# Patient Record
Sex: Male | Born: 1970 | Race: White | Hispanic: No | Marital: Married
Health system: Midwestern US, Academic
[De-identification: ages and names within clinical notes are randomized; demographics above are authoritative.]

---

## 2017-09-08 ENCOUNTER — Encounter: Admit: 2017-09-08 | Discharge: 2017-09-08 | Payer: BC Managed Care – PPO

## 2017-09-08 DIAGNOSIS — R931 Abnormal findings on diagnostic imaging of heart and coronary circulation: Principal | ICD-10-CM

## 2017-09-08 DIAGNOSIS — I10 Essential (primary) hypertension: Principal | ICD-10-CM

## 2017-09-09 LAB — COMPREHENSIVE METABOLIC PANEL
Lab: 0.6
Lab: 110
Lab: 17
Lab: 18
Lab: 4.5
Lab: 62
Lab: 7.7

## 2017-09-09 LAB — LIPID PROFILE
Lab: 23
Lab: 5

## 2017-09-10 ENCOUNTER — Ambulatory Visit: Admit: 2017-09-10 | Discharge: 2017-09-11 | Payer: BC Managed Care – PPO

## 2017-09-10 ENCOUNTER — Encounter: Admit: 2017-09-10 | Discharge: 2017-09-10 | Payer: BC Managed Care – PPO

## 2017-09-10 DIAGNOSIS — R931 Abnormal findings on diagnostic imaging of heart and coronary circulation: ICD-10-CM

## 2017-09-10 DIAGNOSIS — E782 Mixed hyperlipidemia: ICD-10-CM

## 2017-09-10 DIAGNOSIS — I10 Essential (primary) hypertension: Principal | ICD-10-CM

## 2017-09-10 DIAGNOSIS — Z8249 Family history of ischemic heart disease and other diseases of the circulatory system: ICD-10-CM

## 2017-09-10 MED ORDER — ATORVASTATIN 20 MG PO TAB
20 mg | ORAL_TABLET | Freq: Every day | ORAL | 3 refills | Status: AC
Start: 2017-09-10 — End: 2018-11-18

## 2017-10-22 ENCOUNTER — Encounter: Admit: 2017-10-22 | Discharge: 2017-10-22 | Payer: BC Managed Care – PPO

## 2017-10-22 DIAGNOSIS — R931 Abnormal findings on diagnostic imaging of heart and coronary circulation: ICD-10-CM

## 2017-10-22 DIAGNOSIS — I10 Essential (primary) hypertension: Principal | ICD-10-CM

## 2017-10-22 DIAGNOSIS — E782 Mixed hyperlipidemia: ICD-10-CM

## 2018-11-18 ENCOUNTER — Encounter: Admit: 2018-11-18 | Discharge: 2018-11-18

## 2018-11-18 MED ORDER — ATORVASTATIN 20 MG PO TAB
ORAL_TABLET | Freq: Every day | 1 refills | Status: AC
Start: 2018-11-18 — End: ?

## 2019-09-14 IMAGING — RF FINGERS, RT
1 series · 2 of 2 positions shown · non-contrast
Comparison: none

[Series 1: run · 2 of 2 slices shown]
[im 1/2]
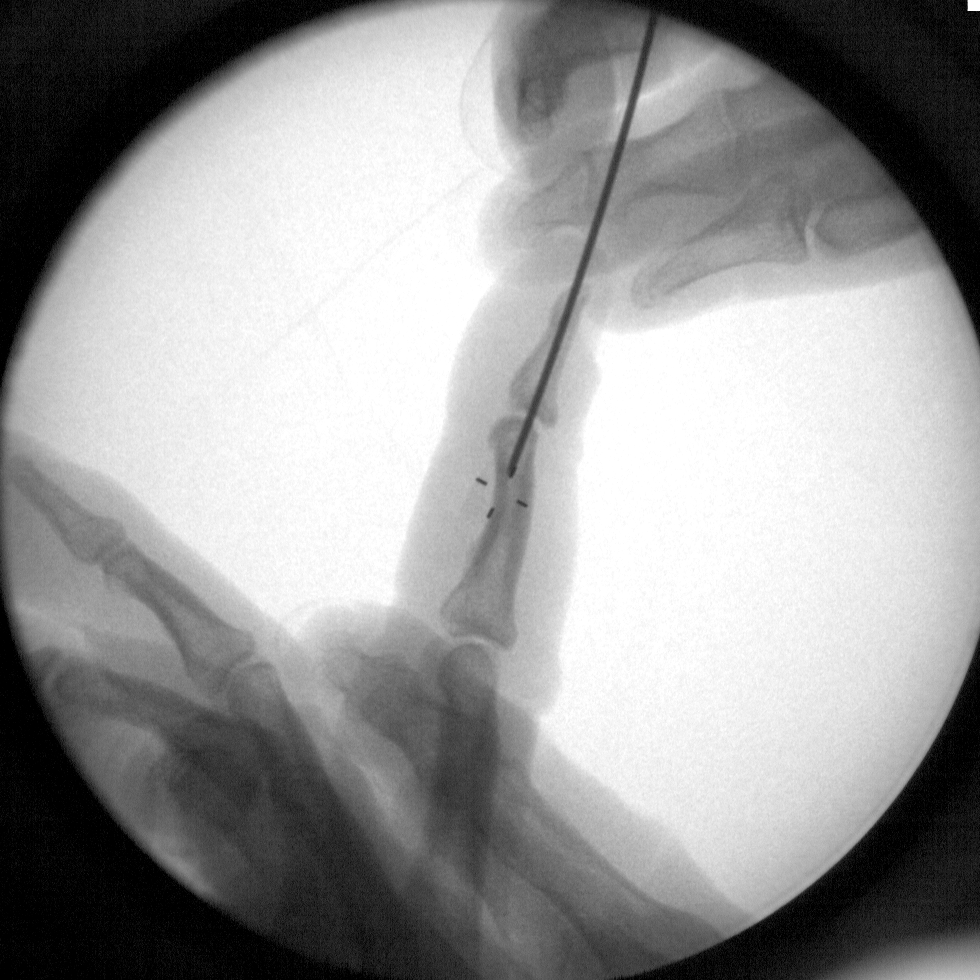
[im 2/2]
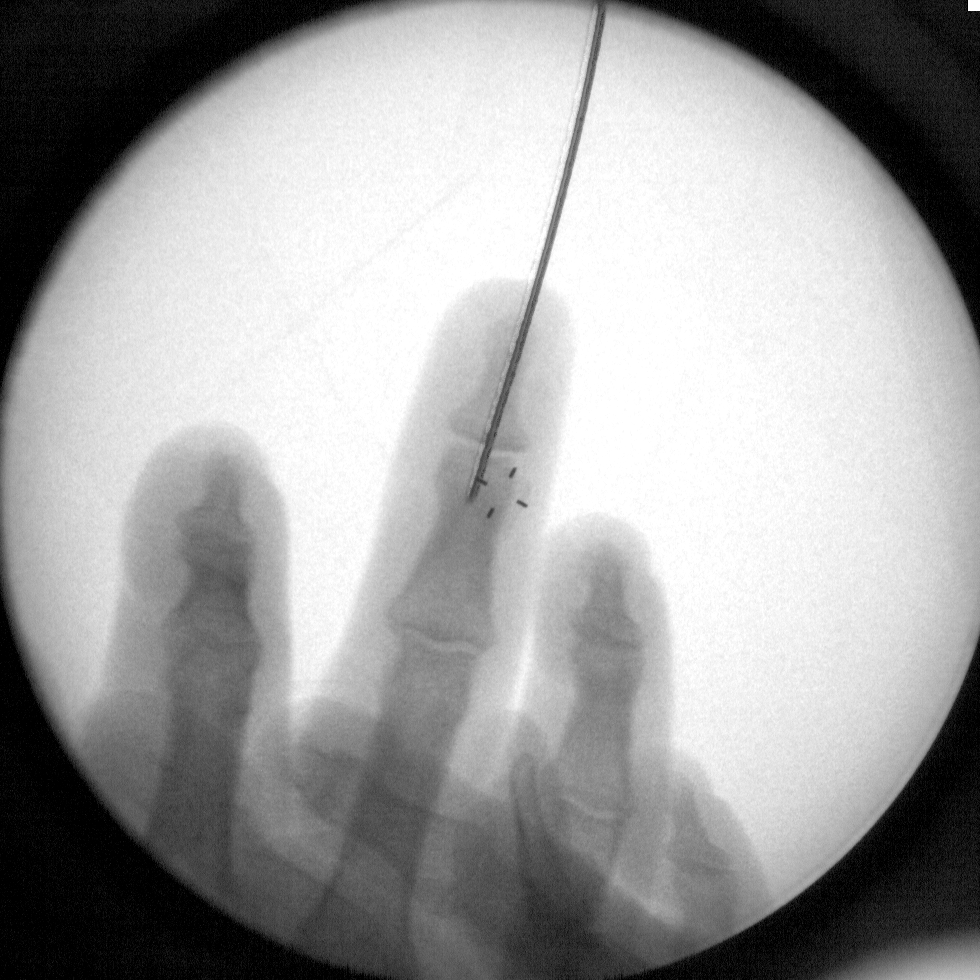

[2 of 2 positions shown; findings below may reference images not displayed]

EXAM

Fluoroscopic guided right fingers

INDICATION

PERCUTANEUS PINNING FOR RT FINGER FX
PERCUTANEUS PINNING FOR RT FINGER FX. CE/AB

FINDINGS

Three spot fluoroscopic images were obtained for a total time of 6.1 seconds. For further details
see operative note.

IMPRESSION

Fluoroscopic guidance provided for internal fixation of right finger.

## 2020-03-05 ENCOUNTER — Encounter: Admit: 2020-03-05 | Discharge: 2020-03-05 | Payer: BC Managed Care – PPO

## 2020-03-05 NOTE — Progress Notes
Paul Collier, 07-06-1970 has an appointment with Dr. Iver Nestle on 03/09/20.    Please send recent lab results for continuity of care.    Thank you,   Delia Slatten    Phone: [phone]  Fax: [phone]

## 2020-03-06 ENCOUNTER — Encounter: Admit: 2020-03-06 | Discharge: 2020-03-06 | Payer: BC Managed Care – PPO

## 2020-03-06 NOTE — Progress Notes
Per patient had a recent CT Calcium Score.  Per [facility] will fax.

## 2020-03-09 ENCOUNTER — Encounter: Admit: 2020-03-09 | Discharge: 2020-03-09 | Payer: Private Health Insurance - Indemnity

## 2020-03-09 ENCOUNTER — Ambulatory Visit: Admit: 2020-03-09 | Discharge: 2020-03-10 | Payer: Private Health Insurance - Indemnity

## 2020-03-09 DIAGNOSIS — I10 Essential (primary) hypertension: Secondary | ICD-10-CM

## 2020-03-09 MED ORDER — ASPIRIN 81 MG PO TBEC
81 mg | ORAL_TABLET | Freq: Every day | ORAL | 3 refills | Status: AC
Start: 2020-03-09 — End: ?

## 2020-03-09 NOTE — Progress Notes
Date of Service: 03/09/2020    Paul Collier is a 49 y.o. male.       HPI   Paul Collier is a pleasant 49 year old gentleman, coming after a gap of 2 years.    There is history of hypertension, dyslipidemia, chewing tobacco, and family history of premature coronary disease. He had a calcium scoring done in 2019 at [facility]. The score was 351 with most of the calcification in the LAD and RCA territory. For his age and gender he was felt to be in the 99 percentile. He was appropriately requested to start taking atorvastatin but took it for a few days and stopped due to myalgias. He had her cholesterol rechecked a couple of weeks ago and the LDL was 122. He has been taking Crestor at 5 mg daily for the last 2 weeks and so far has been tolerating this dosage fairly well.    He mentions occasional left-parasternal chest pains. The pattern is fairly stable. Some of the episodes are triggered by emotional stress. There is no associated dyspnea or diaphoresis. He describes it as a hollow feeling, lasting a few minutes at a time. Sometimes he feels that is coming from superficial chest/wall     He is also taking his amlodipine and losartan on a regular basis.    Father at the first [facility] 71. He finally passed away at the age of 29.    Social history?non-smoker, chews tobacco on a regular basis.    Allergies?none known. He was previously unable to tolerate atorvastatin.                Vitals:    03/09/20 1107   BP: 130/86   BP Source: Arm, Right Upper   Patient Position: Sitting   Pulse: 53   SpO2: 98%   Weight: 102 kg (224 lb 12.8 oz)   Height: 1.727 m (5' 8)   PainSc: Zero     Body mass index is 34.18 kg/m?Marland Kitchen     Past Medical History  Patient Active Problem List    Diagnosis Date Noted   ? Mixed hyperlipidemia 09/10/2017   ? Family history of coronary artery disease 09/10/2017   ? Abnormal Heart Score CT 09/08/2017     08/21/2017  CT Heart Score: total calcification score 351     ? Essential hypertension 07/12/2014     07/28/2014 Rest Echo: Overall left ventricular systolic function is normal. EF~ 55%.  No significant valvular stenosis or regurgitation. No significant pericardial effusion. Stress Test: No c/o chest pain. Good exercise tolerance. Stress ECG: Baseline artifacts. No diagnostic ST segment changes suggestive of ischemia. No arrhythmia. Occasional PVCs.  Stress echo: Negative for ischemia. Left ventricular volume decreased and systolic function improved with exercise.           Review of Systems   Constitutional: Negative.   HENT: Negative.    Eyes: Negative.    Cardiovascular: Negative.    Respiratory: Negative.    Endocrine: Negative.    Hematologic/Lymphatic: Negative.    Skin: Negative.    Gastrointestinal: Negative.    Genitourinary: Negative.    Neurological: Negative.    Psychiatric/Behavioral: Negative.    Allergic/Immunologic: Negative.        Physical Exam  General Appearance: well for age, appears comfortable  Skin: warm, no ulcers, no xanthomas  Eyes/lids: no conjunctival pallor, no arcus, no xanthelasma  Lips/oral mucosa: no cyanosis  Neck: neck veins flat  Carotids: normal upstroke, no bruit  Chest:  normal appearance  Lungs: clear  Cardiac rhythm: regular rhythm & normal rate  Cardiac auscultation: Normal S1 & S2, no S3 or S4, no murmur  Abdomen: soft, non tender, bowel sounds normal, no pulsations/bruits  Extremities: no LE edema, no varicosities, 2+ distal pulses  Neurologic/psych: oriented, no gross motor deficits, normal gait, normal mood & affect     EKG shows sinus rhythm, slight ST flattening in aVF, T inversion in lead III    Cardiovascular Health Factors  Vitals BP Readings from Last 3 Encounters:   03/09/20 130/86   09/10/17 124/86   07/28/14 120/88     Wt Readings from Last 3 Encounters:   03/09/20 102 kg (224 lb 12.8 oz)   09/10/17 105.5 kg (232 lb 9.6 oz)   07/28/14 106.1 kg (234 lb)     BMI Readings from Last 3 Encounters:   03/09/20 34.18 kg/m?   09/10/17 35.37 kg/m?   07/28/14 36.64 kg/m?      Smoking Social History     Tobacco Use   Smoking Status Former Smoker   Smokeless Tobacco Current User   ? Types: Chew      Lipid Profile Cholesterol   Date Value Ref Range Status   02/23/2020 199  Final     HDL   Date Value Ref Range Status   02/23/2020 39 (A)  Final     LDL   Date Value Ref Range Status   02/23/2020 122 (A)  Final     Triglycerides   Date Value Ref Range Status   02/23/2020 190 (A)  Final      Blood Sugar Hemoglobin A1C   Date Value Ref Range Status   09/09/2017 5.1  Final     Glucose   Date Value Ref Range Status   02/23/2020 96  Final   09/09/2017 100  Final   06/23/2014 96  Final          Problems Addressed Today  Encounter Diagnoses   Name Primary?   ? Essential hypertension Yes   ? Mixed hyperlipidemia    ? Abnormal Heart Score CT    ? Family history of coronary artery disease        Assessment and Plan   Paul Collier is a 49 year old gentleman, with a high risk coronary calcium score of 400 at this time. He is in the 99th percentile for his age and gender. Stress echo done back in 2016 was negative for ischemia. He was advised a stress test in 2019 which she did not do because of cost issues (was told his co-pay would be $2000). He has risk factors of family history of premature coronary disease, essential hypertension and uncontrolled dyslipidemia.     Aspirin at 81 mg daily is being started. He'll continue with Crestor at 5 mg daily along with his losartan and amlodipine. The pattern of chest pains is fairly stable. The episodes seem to be triggered with emotional stress. We'll obtain a CCTA as the next step. We talked about weight loss and adverse effects of chewing tobacco. The most important step would be aggressive control of his cholesterol. Repeat cholesterol will be checked in 2 months. He'll probably require further adjustment of his Crestor dosing.    It is a pleasure to see Paul Collier in the clinic today.           Current Medications (including today's revisions)  ? amLODIPine (NORVASC) 10 mg tablet Take 10 mg by mouth daily.   ? amLODIPine (NORVASC) 5  mg tablet Take 1 tablet by mouth daily.   ? ascorbic acid (VITAMIN C) 500 mg tablet Take 500 mg by mouth daily.   ? aspirin EC 81 mg tablet Take one tablet by mouth daily. Take with food.   ? docosahexanoic acid/epa (FISH OIL PO) Take 2 tablets by mouth daily.   ? losartan (COZAAR) 100 mg tablet Take 1 tablet by mouth daily.   ? rosuvastatin (CRESTOR) 5 mg tablet Take 5 mg by mouth daily.

## 2020-03-10 DIAGNOSIS — Z8249 Family history of ischemic heart disease and other diseases of the circulatory system: Secondary | ICD-10-CM

## 2020-03-10 DIAGNOSIS — R931 Abnormal findings on diagnostic imaging of heart and coronary circulation: Secondary | ICD-10-CM

## 2020-03-10 DIAGNOSIS — I10 Essential (primary) hypertension: Secondary | ICD-10-CM

## 2020-03-10 DIAGNOSIS — E782 Mixed hyperlipidemia: Secondary | ICD-10-CM

## 2020-03-12 ENCOUNTER — Encounter: Admit: 2020-03-12 | Discharge: 2020-03-12 | Payer: Private Health Insurance - Indemnity

## 2020-03-12 NOTE — Telephone Encounter
-----   Message from Marland Kitchen, RN sent at 03/09/2020 11:59 AM CDT -----  Dr. Iver Nestle has ordered a CCTA. Please contact pt to schedule.     Thank you,  Porfirio Mylar

## 2020-03-27 ENCOUNTER — Ambulatory Visit: Admit: 2020-03-27 | Discharge: 2020-03-27 | Payer: Private Health Insurance - Indemnity

## 2020-03-27 ENCOUNTER — Encounter: Admit: 2020-03-27 | Discharge: 2020-03-27 | Payer: Private Health Insurance - Indemnity

## 2020-03-27 DIAGNOSIS — I10 Essential (primary) hypertension: Secondary | ICD-10-CM

## 2020-03-27 DIAGNOSIS — R931 Abnormal findings on diagnostic imaging of heart and coronary circulation: Secondary | ICD-10-CM

## 2020-03-27 DIAGNOSIS — E782 Mixed hyperlipidemia: Secondary | ICD-10-CM

## 2020-03-27 DIAGNOSIS — Z8249 Family history of ischemic heart disease and other diseases of the circulatory system: Secondary | ICD-10-CM

## 2020-03-27 MED ORDER — METOPROLOL TARTRATE 5 MG/5 ML IV SOLN
5 mg | INTRAVENOUS | 0 refills | Status: AC | PRN
Start: 2020-03-27 — End: ?

## 2020-03-27 MED ORDER — SODIUM CHLORIDE 0.9 % IV SOLP
250 mL | INTRAVENOUS | 0 refills | Status: AC | PRN
Start: 2020-03-27 — End: ?

## 2020-03-27 MED ORDER — DIPHENHYDRAMINE HCL 50 MG PO CAP
50 mg | Freq: Once | ORAL | 0 refills | Status: AC | PRN
Start: 2020-03-27 — End: ?

## 2020-03-27 MED ORDER — SODIUM CHLORIDE 0.9 % IV SOLP
250 mL | INTRAVENOUS | 0 refills | Status: AC
Start: 2020-03-27 — End: ?

## 2020-03-27 MED ORDER — SODIUM CHLORIDE 0.9 % IJ SOLN
100 mL | Freq: Once | INTRAVENOUS | 0 refills | Status: CP
Start: 2020-03-27 — End: ?
  Administered 2020-03-27: 18:00:00 100 mL via INTRAVENOUS

## 2020-03-27 MED ORDER — METHYLPREDNISOLONE SOD SUC(PF) 125 MG/2 ML IJ SOLR
125 mg | Freq: Once | INTRAVENOUS | 0 refills | Status: AC | PRN
Start: 2020-03-27 — End: ?

## 2020-03-27 MED ORDER — DIPHENHYDRAMINE HCL 50 MG/ML IJ SOLN
50 mg | Freq: Once | INTRAVENOUS | 0 refills | Status: AC | PRN
Start: 2020-03-27 — End: ?

## 2020-03-27 MED ORDER — IOPAMIDOL 76 % IV SOLN
100 mL | Freq: Once | INTRAVENOUS | 0 refills | Status: CP
Start: 2020-03-27 — End: ?
  Administered 2020-03-27: 18:00:00 100 mL via INTRAVENOUS

## 2020-03-27 MED ORDER — NITROGLYCERIN 0.4 MG SL SUBL
.4 mg | SUBLINGUAL | 0 refills | Status: AC | PRN
Start: 2020-03-27 — End: ?
  Administered 2020-03-27: 17:00:00 0.4 mg via SUBLINGUAL

## 2020-03-27 MED ORDER — IVABRADINE 5 MG PO TAB
15 mg | Freq: Once | ORAL | 0 refills | Status: AC | PRN
Start: 2020-03-27 — End: ?

## 2020-03-28 ENCOUNTER — Encounter: Admit: 2020-03-28 | Discharge: 2020-03-28 | Payer: Private Health Insurance - Indemnity

## 2020-05-02 ENCOUNTER — Encounter: Admit: 2020-05-02 | Discharge: 2020-05-02 | Payer: Private Health Insurance - Indemnity

## 2020-07-30 MED ORDER — ASPIRIN 81 MG PO TBEC
81 mg | ORAL_TABLET | Freq: Every day | ORAL | 3 refills | Status: AC
Start: 2020-07-30 — End: ?

## 2020-08-31 ENCOUNTER — Encounter: Admit: 2020-08-31 | Discharge: 2020-08-31 | Payer: Private Health Insurance - Indemnity

## 2020-08-31 NOTE — Progress Notes
Patient has visit with Dr. Iver Nestle on 09-11-20.  Please fax most recent lab results to [phone].    Thank you,  Porfirio Mylar

## 2021-05-07 ENCOUNTER — Encounter: Admit: 2021-05-07 | Discharge: 2021-05-07 | Payer: Private Health Insurance - Indemnity

## 2021-05-07 MED ORDER — ASPIRIN 81 MG PO TBEC
81 mg | ORAL_TABLET | Freq: Every day | ORAL | 0 refills | Status: AC
Start: 2021-05-07 — End: ?

## 2021-05-07 NOTE — Telephone Encounter
05/07/2021 9:18 AM    Refilled ASA for 90 days. Message sent to scheduling to get yearly follow up scheduled. Message sent to pharmacy as well.

## 2021-11-27 ENCOUNTER — Encounter: Admit: 2021-11-27 | Discharge: 2021-11-27 | Payer: Private Health Insurance - Indemnity

## 2021-11-27 MED ORDER — ASPIRIN 81 MG PO TBEC
ORAL_TABLET | 3 refills | Status: AC
Start: 2021-11-27 — End: ?

## 2022-08-12 ENCOUNTER — Encounter: Admit: 2022-08-12 | Discharge: 2022-08-12 | Payer: Private Health Insurance - Indemnity

## 2022-08-12 MED ORDER — ASPIRIN 81 MG PO TBEC
ORAL_TABLET | 2 refills
Start: 2022-08-12 — End: ?

## 2022-09-03 IMAGING — CT STONE PROTOCOL(Adult)
2 series · 7 of 12 positions shown, 13 images · non-contrast
Comparison: none

[Series 2: abdomen ax 2.00 br40 s3 · axial · 0.55mm/px · z∈[+1517,+1777]mm · 6 of 8 slices shown, 11 images]
[im 2/8  soft-tissue]
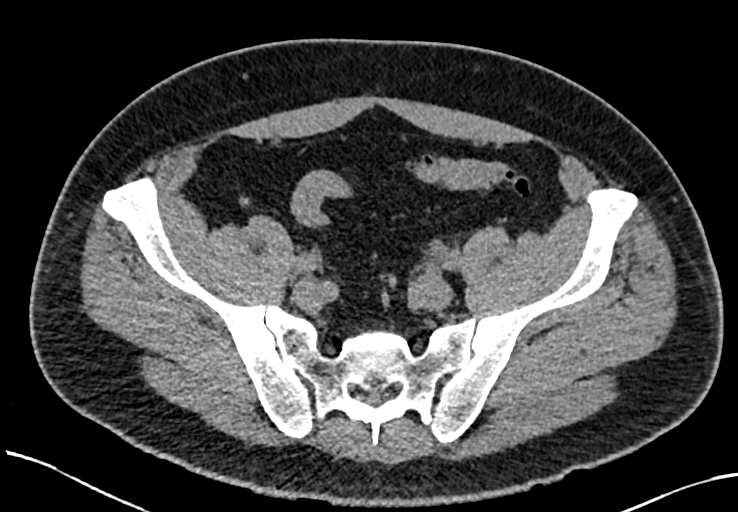
[im 2/8  bone]
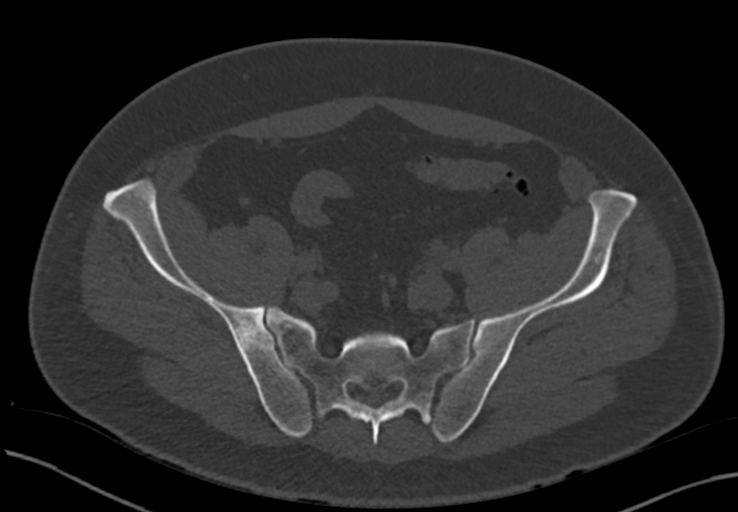
[im 3/8  soft-tissue]
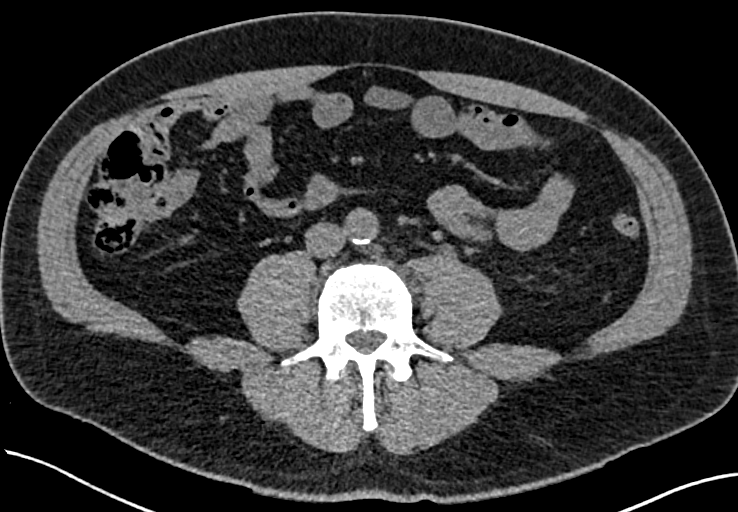
[im 4/8  soft-tissue]
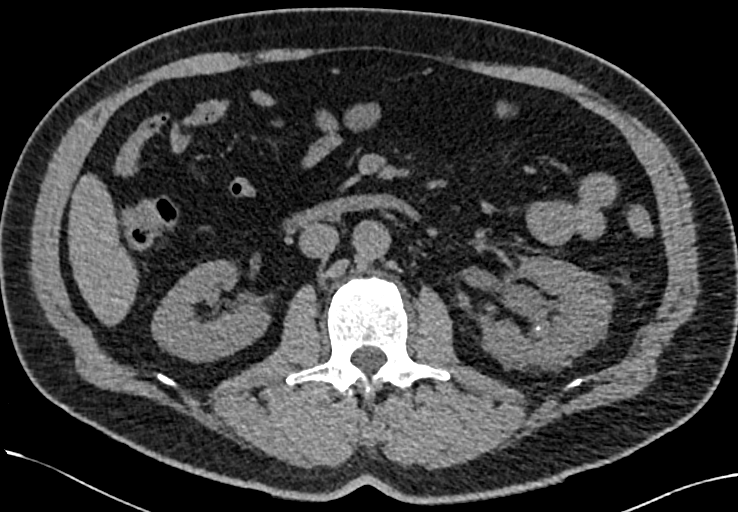
[im 4/8  lung]
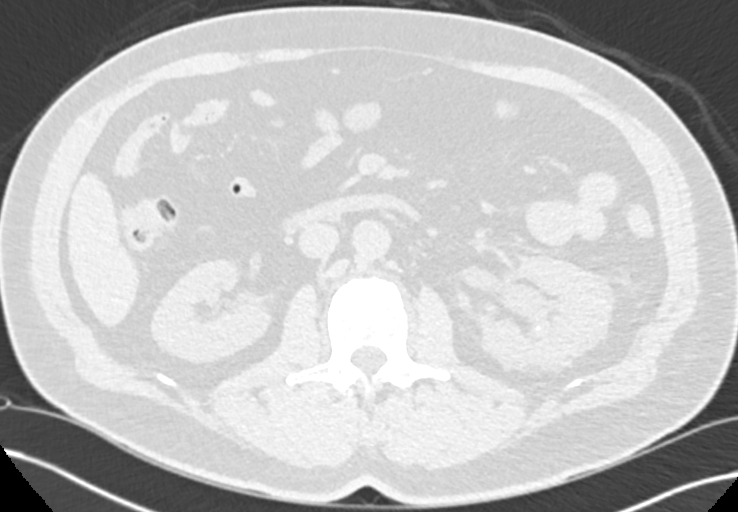
[im 5/8  soft-tissue]
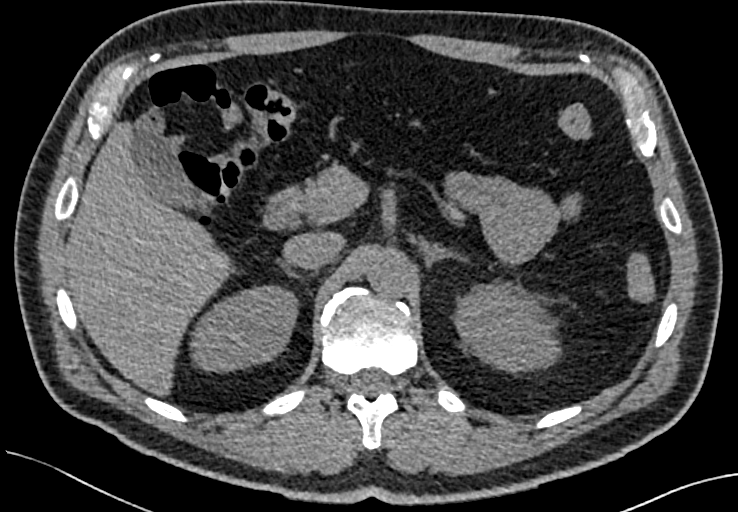
[im 5/8  lung]
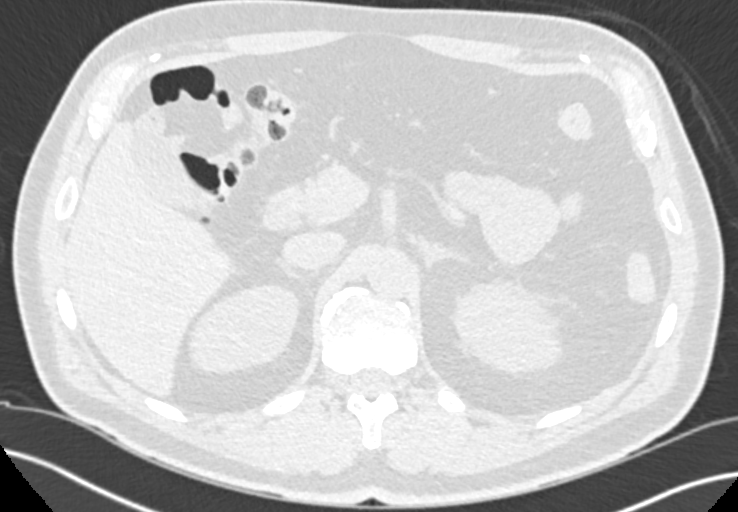
[im 6/8  soft-tissue]
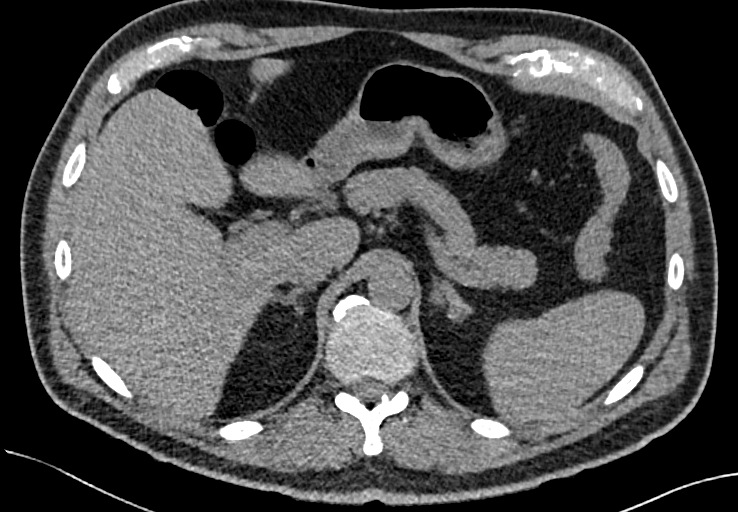
[im 6/8  lung]
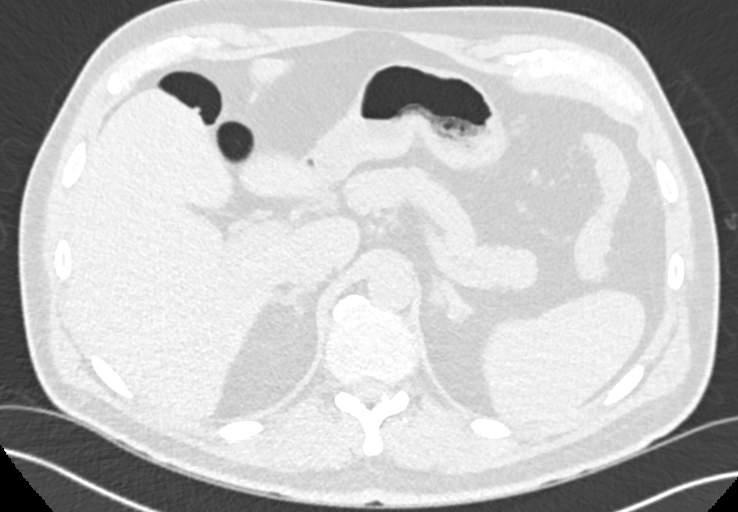
[im 7/8  soft-tissue]
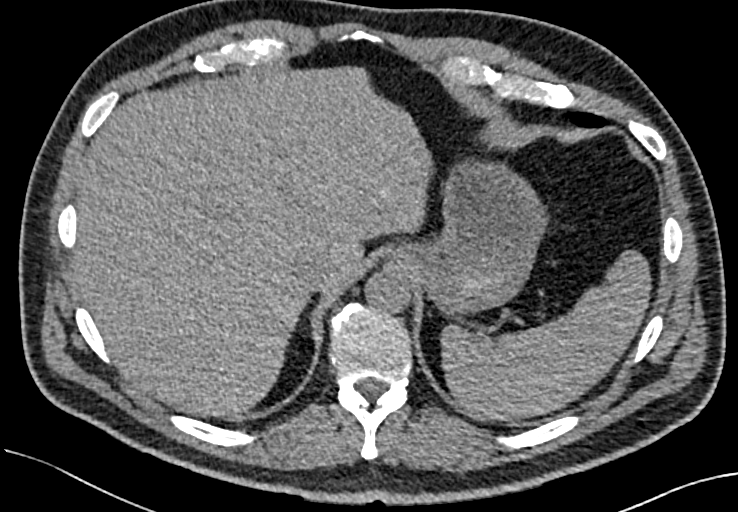
[im 7/8  lung]
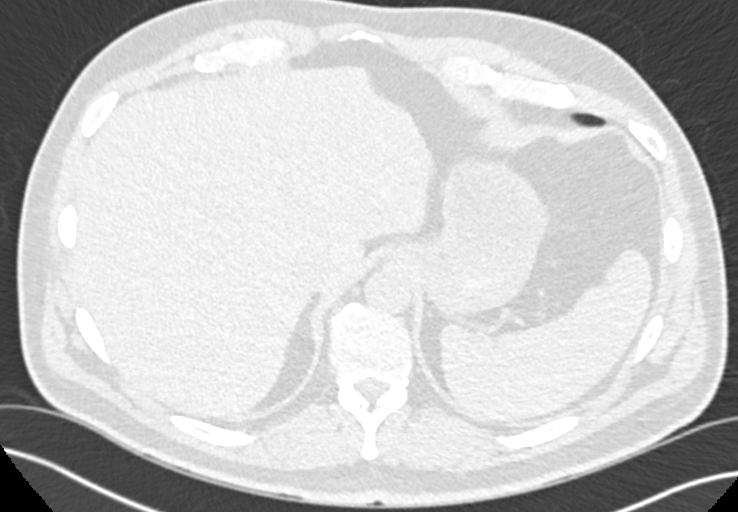

[Series 6: abdomen sag 2.00 br40 s3 · sagittal · 0.55mm/px · 1 of 5 slices shown, 2 images]
[im 2/5  soft-tissue]
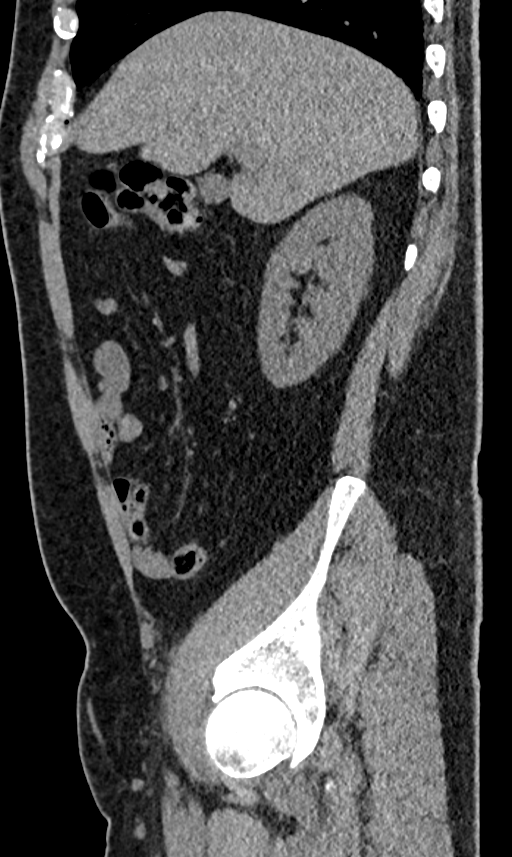
[im 2/5  bone]
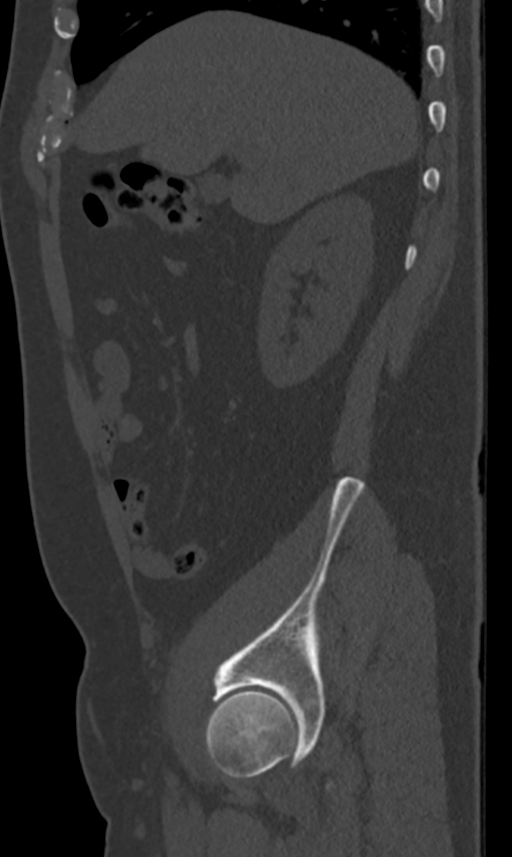

[7 of 12 positions shown; findings below may reference images not displayed]

DIAGNOSTIC STUDIES

EXAM

CT of the abdomen and pelvis without contrast

INDICATION

possible kidney stone left
LEFT FLANK PAIN WITH HX OF RENAL STONE 5 YEARS AGO, URGENCY, DYSURIA.  RG

TECHNIQUE

All CT scans at this facility use dose modulation, iterative reconstruction, and/or weight based
dosing when appropriate to reduce radiation dose to as low as reasonably achievable.

Number of previous computed tomography exams in the last 12 months is 1.

Number of previous nuclear medicine myocardial perfusion studies in the last 12 months is 0  .

COMPARISONS

None

FINDINGS

Lung bases: No focal pneumonia in the lung bases.

Liver: Normal appearing.

Gallbladder: Normal appearing.

Bile ducts: No intra or extrahepatic biliary ductal dilation.

Spleen: Normal appearing.

Pancreas: Normal appearing.

Adrenals: Normal appearing.

Kidneys: In the left distal ureter just above the ureterovesicular junction there is a 5 millimeter
calculus causing mild hydronephrosis and hydroureter. Additional punctate nonobstructive left renal
midpole calculus. No right-sided calculi or hydronephrosis.

Vasculature: Abdominal aorta is normal in caliber with mild atheromatous plaque.

Bowel: No dilated loops of bowel. No obstruction. Mild descending colon and sigmoid diverticulosis.
Appendix is normal.

Free fluid: None.

Lymph nodes: No enlarged nodes in the abdomen or pelvis by size criteria.

Bladder: Not well distended. In this setting mild wall thickening is nonspecific.

Osseous structures: Degenerative changes of the spine.

IMPRESSION

Left distal ureteral 5 millimeter calculus just above the ureterovesicular junction. This causes
mild left hydronephrosis.

Additional nonobstructive punctate left renal calculus.

Bladder is not well distended. In this setting mild wall thickening is nonspecific, UTI cannot be
excluded.

Mild distal colonic diverticulosis.

Tech Notes:

LEFT FLANK PAIN WITH HX OF RENAL STONE 5 YEARS AGO, URGENCY, DYSURIA.  RG
